# Patient Record
Sex: Female | Born: 1980 | Race: Black or African American | Hispanic: No | Marital: Single
Health system: Southern US, Community
[De-identification: ages and names within clinical notes are randomized; demographics above are authoritative.]

---

## 2008-07-21 ENCOUNTER — Emergency Department (HOSPITAL_BASED_OUTPATIENT_CLINIC_OR_DEPARTMENT_OTHER): Admission: EM | Admit: 2008-07-21 | Discharge: 2008-07-21 | Payer: Self-pay | Admitting: Emergency Medicine

## 2010-11-12 ENCOUNTER — Emergency Department (HOSPITAL_BASED_OUTPATIENT_CLINIC_OR_DEPARTMENT_OTHER)
Admission: EM | Admit: 2010-11-12 | Discharge: 2010-11-12 | Payer: Self-pay | Source: Home / Self Care | Admitting: Emergency Medicine

## 2010-11-13 ENCOUNTER — Emergency Department (HOSPITAL_BASED_OUTPATIENT_CLINIC_OR_DEPARTMENT_OTHER)
Admission: EM | Admit: 2010-11-13 | Discharge: 2010-11-13 | Payer: Self-pay | Source: Home / Self Care | Admitting: Emergency Medicine

## 2011-08-19 LAB — CBC
Hemoglobin: 10.3 — ABNORMAL LOW
MCHC: 32
MCV: 69.2 — ABNORMAL LOW
RBC: 4.63

## 2011-08-19 LAB — BASIC METABOLIC PANEL
CO2: 28
Chloride: 104
GFR calc Af Amer: >60
Sodium: 142

## 2011-08-19 LAB — DIFFERENTIAL
Basophils Relative: 1
Eosinophils Absolute: 0.1
Eosinophils Relative: 1
Monocytes Absolute: 0.8
Monocytes Relative: 9

## 2012-04-13 ENCOUNTER — Other Ambulatory Visit (HOSPITAL_COMMUNITY): Payer: Self-pay | Admitting: Internal Medicine

## 2012-04-13 DIAGNOSIS — C73 Malignant neoplasm of thyroid gland: Secondary | ICD-10-CM

## 2012-04-22 ENCOUNTER — Encounter (HOSPITAL_COMMUNITY)
Admission: RE | Admit: 2012-04-22 | Discharge: 2012-04-22 | Disposition: A | Discharge status: Home / Self Care | Payer: Medicare Other | Source: Ambulatory Visit | Attending: Internal Medicine | Admitting: Internal Medicine

## 2012-04-22 DIAGNOSIS — C73 Malignant neoplasm of thyroid gland: Secondary | ICD-10-CM | POA: Insufficient documentation

## 2012-04-22 MED ORDER — SODIUM IODIDE I 131 CAPSULE
100.0000 | Freq: Once | INTRAVENOUS | Status: AC | PRN
Start: 2012-04-22 — End: 2012-04-22
  Administered 2012-04-22: 106.8 via ORAL

## 2012-05-02 ENCOUNTER — Encounter (HOSPITAL_COMMUNITY)
Admission: RE | Admit: 2012-05-02 | Discharge: 2012-05-02 | Disposition: A | Discharge status: Home / Self Care | Payer: Medicare Other | Source: Ambulatory Visit | Attending: Internal Medicine | Admitting: Internal Medicine

## 2012-05-02 DIAGNOSIS — C73 Malignant neoplasm of thyroid gland: Secondary | ICD-10-CM | POA: Insufficient documentation

## 2013-04-04 ENCOUNTER — Other Ambulatory Visit (HOSPITAL_COMMUNITY): Payer: Self-pay | Admitting: Internal Medicine

## 2013-04-04 DIAGNOSIS — C73 Malignant neoplasm of thyroid gland: Secondary | ICD-10-CM

## 2013-04-05 ENCOUNTER — Other Ambulatory Visit (HOSPITAL_COMMUNITY): Payer: Self-pay | Admitting: Internal Medicine

## 2013-04-05 DIAGNOSIS — C73 Malignant neoplasm of thyroid gland: Secondary | ICD-10-CM

## 2013-04-17 ENCOUNTER — Encounter (HOSPITAL_COMMUNITY)
Admission: RE | Admit: 2013-04-17 | Discharge: 2013-04-17 | Disposition: A | Discharge status: Home / Self Care | Payer: Medicare Other | Source: Ambulatory Visit | Attending: Internal Medicine | Admitting: Internal Medicine

## 2013-04-17 DIAGNOSIS — C73 Malignant neoplasm of thyroid gland: Secondary | ICD-10-CM

## 2013-04-17 MED ORDER — THYROTROPIN ALFA 1.1 MG IM SOLR
0.9000 mg | INTRAMUSCULAR | Status: AC
  Administered 2013-04-17: 0.9 mg via INTRAMUSCULAR

## 2013-04-18 ENCOUNTER — Encounter (HOSPITAL_COMMUNITY)
Admission: RE | Admit: 2013-04-18 | Discharge: 2013-04-18 | Disposition: A | Discharge status: Home / Self Care | Payer: Medicare Other | Source: Ambulatory Visit | Attending: Internal Medicine | Admitting: Internal Medicine

## 2013-04-18 ENCOUNTER — Ambulatory Visit (HOSPITAL_COMMUNITY)
Admission: RE | Admit: 2013-04-18 | Discharge: 2013-04-18 | Disposition: A | Discharge status: Home / Self Care | Payer: Medicare Other | Source: Ambulatory Visit | Attending: Internal Medicine | Admitting: Internal Medicine

## 2013-04-18 DIAGNOSIS — C73 Malignant neoplasm of thyroid gland: Secondary | ICD-10-CM

## 2013-04-18 DIAGNOSIS — Z85850 Personal history of malignant neoplasm of thyroid: Secondary | ICD-10-CM | POA: Insufficient documentation

## 2013-04-18 DIAGNOSIS — Z09 Encounter for follow-up examination after completed treatment for conditions other than malignant neoplasm: Secondary | ICD-10-CM | POA: Insufficient documentation

## 2013-04-18 MED ORDER — THYROTROPIN ALFA 1.1 MG IM SOLR
0.9000 mg | INTRAMUSCULAR | Status: AC
  Administered 2013-04-18: 0.9 mg via INTRAMUSCULAR

## 2013-04-19 ENCOUNTER — Encounter (HOSPITAL_COMMUNITY)
Admission: RE | Admit: 2013-04-19 | Discharge: 2013-04-19 | Disposition: A | Discharge status: Home / Self Care | Payer: Medicare Other | Source: Ambulatory Visit | Attending: Internal Medicine | Admitting: Internal Medicine

## 2013-04-19 DIAGNOSIS — C73 Malignant neoplasm of thyroid gland: Secondary | ICD-10-CM | POA: Insufficient documentation

## 2013-04-19 MED ORDER — SODIUM IODIDE I 131 CAPSULE
4.0000 | Freq: Once | INTRAVENOUS | Status: AC | PRN
  Administered 2013-04-19: 4 via ORAL

## 2013-04-19 MED FILL — Thyrotropin Alfa For Inj 1.1 MG: INTRAMUSCULAR | Qty: 0.9 | Status: AC

## 2013-04-21 ENCOUNTER — Encounter (HOSPITAL_COMMUNITY)
Admission: RE | Admit: 2013-04-21 | Discharge: 2013-04-21 | Disposition: A | Discharge status: Home / Self Care | Payer: Medicare Other | Source: Ambulatory Visit | Attending: Internal Medicine | Admitting: Internal Medicine

## 2013-04-21 DIAGNOSIS — C73 Malignant neoplasm of thyroid gland: Secondary | ICD-10-CM | POA: Insufficient documentation

## 2013-04-21 MED ORDER — SODIUM IODIDE I 131 CAPSULE
4.0000 | Freq: Once | INTRAVENOUS | Status: AC | PRN
  Administered 2013-04-21: 4 via ORAL

## 2014-06-28 ENCOUNTER — Other Ambulatory Visit (HOSPITAL_COMMUNITY): Payer: Self-pay | Admitting: Internal Medicine

## 2014-06-28 DIAGNOSIS — C73 Malignant neoplasm of thyroid gland: Secondary | ICD-10-CM

## 2014-07-09 ENCOUNTER — Encounter (HOSPITAL_COMMUNITY)
Admission: RE | Admit: 2014-07-09 | Discharge: 2014-07-09 | Disposition: A | Discharge status: Home / Self Care | Payer: PRIVATE HEALTH INSURANCE | Source: Ambulatory Visit | Attending: Internal Medicine | Admitting: Internal Medicine

## 2014-07-09 DIAGNOSIS — C73 Malignant neoplasm of thyroid gland: Secondary | ICD-10-CM | POA: Insufficient documentation

## 2014-07-09 MED ORDER — THYROTROPIN ALFA 1.1 MG IM SOLR
0.9000 mg | INTRAMUSCULAR | Status: AC
  Administered 2014-07-09: 0.9 mg via INTRAMUSCULAR

## 2014-07-10 ENCOUNTER — Encounter (HOSPITAL_COMMUNITY)
Admission: RE | Admit: 2014-07-10 | Discharge: 2014-07-10 | Disposition: A | Discharge status: Home / Self Care | Payer: PRIVATE HEALTH INSURANCE | Source: Ambulatory Visit | Attending: Internal Medicine | Admitting: Internal Medicine

## 2014-07-10 DIAGNOSIS — C73 Malignant neoplasm of thyroid gland: Secondary | ICD-10-CM | POA: Diagnosis not present

## 2014-07-10 MED ORDER — THYROTROPIN ALFA 1.1 MG IM SOLR
0.9000 mg | INTRAMUSCULAR | Status: AC
  Administered 2014-07-10: 0.9 mg via INTRAMUSCULAR

## 2014-07-11 ENCOUNTER — Encounter (HOSPITAL_COMMUNITY)
Admission: RE | Admit: 2014-07-11 | Discharge: 2014-07-11 | Disposition: A | Discharge status: Home / Self Care | Payer: PRIVATE HEALTH INSURANCE | Source: Ambulatory Visit | Attending: Internal Medicine | Admitting: Internal Medicine

## 2014-07-11 DIAGNOSIS — C73 Malignant neoplasm of thyroid gland: Secondary | ICD-10-CM | POA: Diagnosis not present

## 2014-07-11 LAB — HCG, SERUM, QUALITATIVE: PREG SERUM: NEGATIVE

## 2014-07-13 ENCOUNTER — Encounter (HOSPITAL_COMMUNITY)
Admission: RE | Admit: 2014-07-13 | Discharge: 2014-07-13 | Disposition: A | Discharge status: Home / Self Care | Payer: PRIVATE HEALTH INSURANCE | Source: Ambulatory Visit | Attending: Internal Medicine | Admitting: Internal Medicine

## 2014-07-13 DIAGNOSIS — C73 Malignant neoplasm of thyroid gland: Secondary | ICD-10-CM | POA: Diagnosis not present

## 2014-07-13 MED ORDER — SODIUM IODIDE I 131 CAPSULE
4.0000 | Freq: Once | INTRAVENOUS | Status: AC | PRN
  Administered 2014-07-12: 4 via ORAL

## 2014-07-16 LAB — THYROGLOBULIN LEVEL: Thyroglobulin: 0.3 ng/mL — ABNORMAL LOW (ref 2.8–40.9)

## 2015-04-26 IMAGING — NM NM [ID] THYROID CANCER METS WHOLE BODY W/ THYROGEN
4 series · 4 of 4 positions shown · non-contrast
Comparison: Whole-body scan 04/21/2013

CLINICAL DATA: Papillary thyroid cancer status post sub total
thyroidectomy February 2012 and thyroid ablation April 2012 (106 mCi).

EXAM:
THYROGEN-STIMULATED 4-CIC WHOLE BODY SCAN
TECHNIQUE: The patient received 0.9 mg Thyrogen intramuscularly every 24 hours
for two doses. On the third day the patient returned and received
the radiopharmaceutical, per orally. On the fifth day, the patient
returned and whole body planar images were obtained in the anterior
and posterior projections.
RADIOPHARMACEUTICALS:  4.0 [id number] sodium iodide

[Series 1: i131 whole body · 2.66mm/px · 1 of 1 slices shown (1 of 2)]
[im 1/1  full-range]
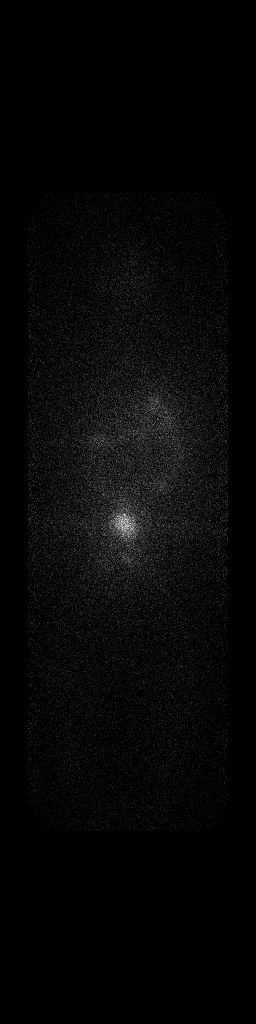

[Series 1: marker · 4.14mm/px · 1 of 1 slices shown]
[im 1/1]
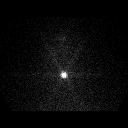

[Series 1: i131 whole body · 2.66mm/px · 1 of 1 slices shown (2 of 2)]
[im 1/1  full-range]
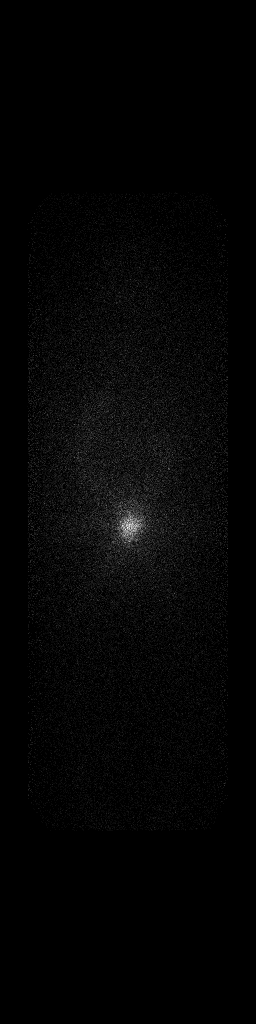

[Series 2: static thyroid no marker · 4.14mm/px · 1 of 1 slices shown]
[im 1/1  full-range]
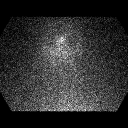

[4 of 4 positions shown; findings below may reference images not displayed]

FINDINGS: No recurrent activity within the thyroid bed. No abnormal uptake on
the whole-body scan. Physiologic uptake noted within the GI and GU
tract.
IMPRESSION: No evidence of local thyroid cancer recurrence or distant
metastasis.
